# Patient Record
Sex: Male | Born: 1965
Health system: Southern US, Community
[De-identification: ages and names within clinical notes are randomized; demographics above are authoritative.]

---

## 2003-11-16 ENCOUNTER — Ambulatory Visit: Payer: Self-pay | Admitting: Sports Medicine

## 2003-11-18 ENCOUNTER — Ambulatory Visit: Payer: Self-pay | Admitting: Family Medicine

## 2004-05-01 ENCOUNTER — Ambulatory Visit: Payer: Self-pay | Admitting: Family Medicine

## 2006-04-17 DIAGNOSIS — E7800 Pure hypercholesterolemia, unspecified: Secondary | ICD-10-CM

## 2006-04-17 DIAGNOSIS — J45909 Unspecified asthma, uncomplicated: Secondary | ICD-10-CM | POA: Insufficient documentation

## 2008-06-03 ENCOUNTER — Encounter: Admission: RE | Admit: 2008-06-03 | Discharge: 2008-06-03 | Payer: Self-pay | Admitting: Family Medicine

## 2008-06-13 ENCOUNTER — Encounter: Admission: RE | Admit: 2008-06-13 | Discharge: 2008-06-13 | Payer: Self-pay | Admitting: Chiropractic Medicine

## 2008-10-26 ENCOUNTER — Emergency Department (HOSPITAL_COMMUNITY): Admission: EM | Admit: 2008-10-26 | Discharge: 2008-10-26 | Payer: Self-pay | Admitting: Emergency Medicine

## 2010-04-29 IMAGING — CR DG CERVICAL SPINE 2 OR 3 VIEWS
2 series · 2 of 2 positions shown · non-contrast
Comparison: None.

CLINICAL DATA: cervical radiculitis and paresthesia.

CERVICAL SPINE - 2-3 VIEW

[view not recorded (1 of 2)]
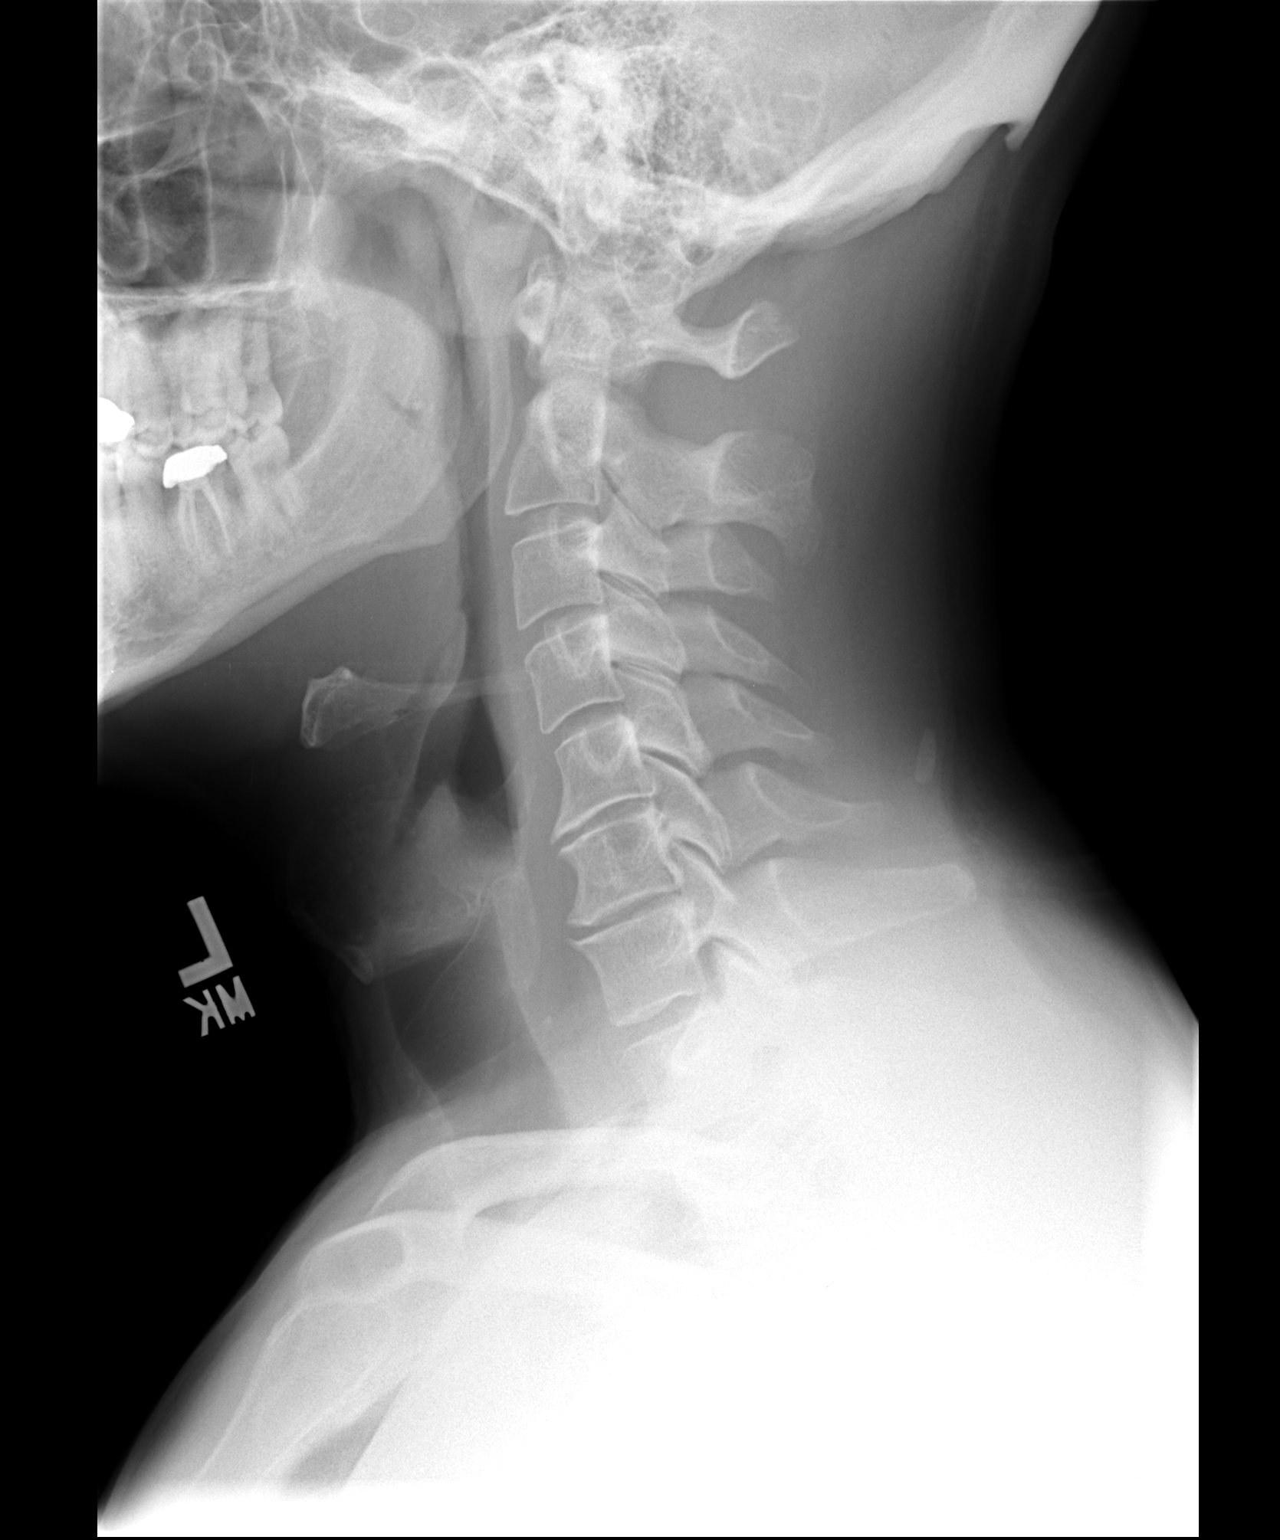

[view not recorded (2 of 2)]
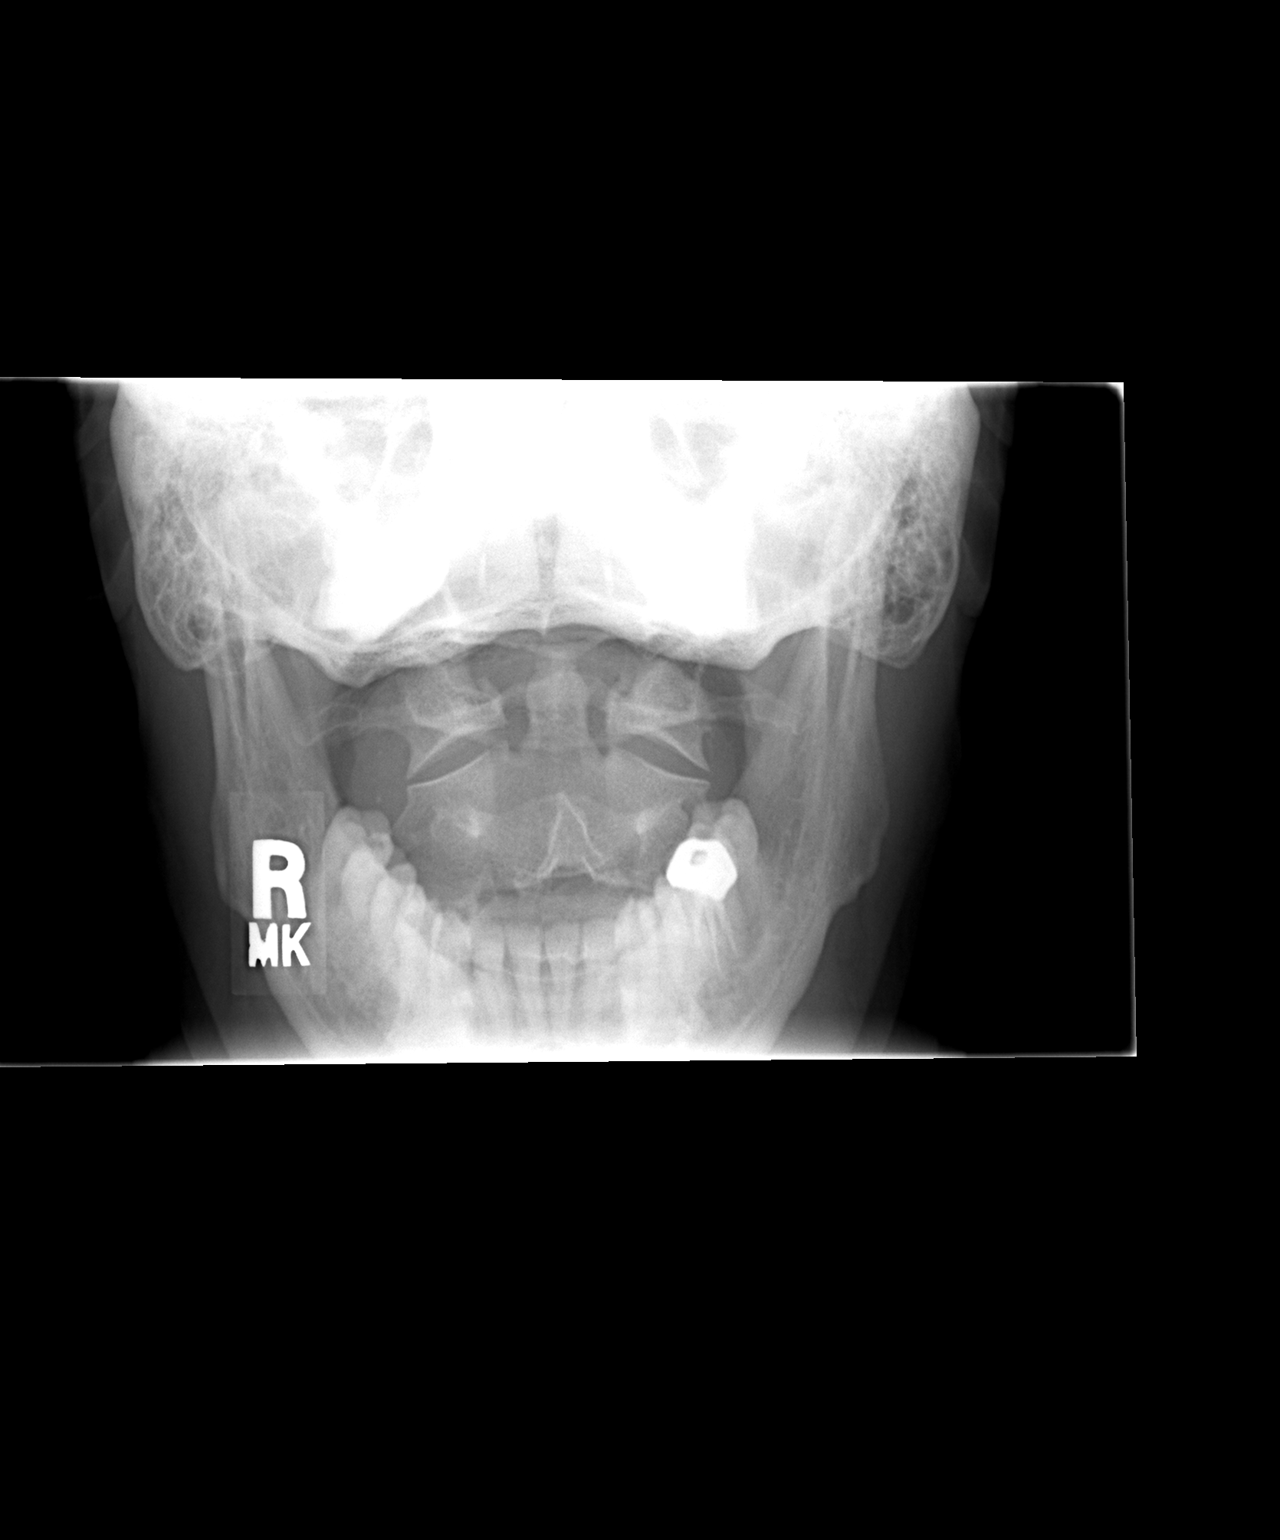

[2 of 2 positions shown; findings below may reference images not displayed]

FINDINGS: Lateral and odontoid views were  obtained, as requested.

Normal alignment and no fracture.  There is disc degeneration and
mild spurring at C5-6 and C6-7.  Oblique views were not obtained to
evaluate the neural foramen.  Remaining disc spaces are intact.
The facet joints appear normal.
IMPRESSION: Mild disc degeneration and spurring at C5-6 and C6-7.  No acute
bony abnormality.

## 2010-04-29 IMAGING — CR DG THORACOLUMBAR SPINE STANDING SCOLIOSIS
1 series · 3 of 3 positions shown · non-contrast
Comparison: None

CLINICAL DATA: Parasthesias.

THORACOLUMBAR SCOLIOSIS STUDY - STANDING VIEWS

[Series 1001: view not recorded · 0.40mm/px · 3 of 3 slices shown]
[im 1/3]
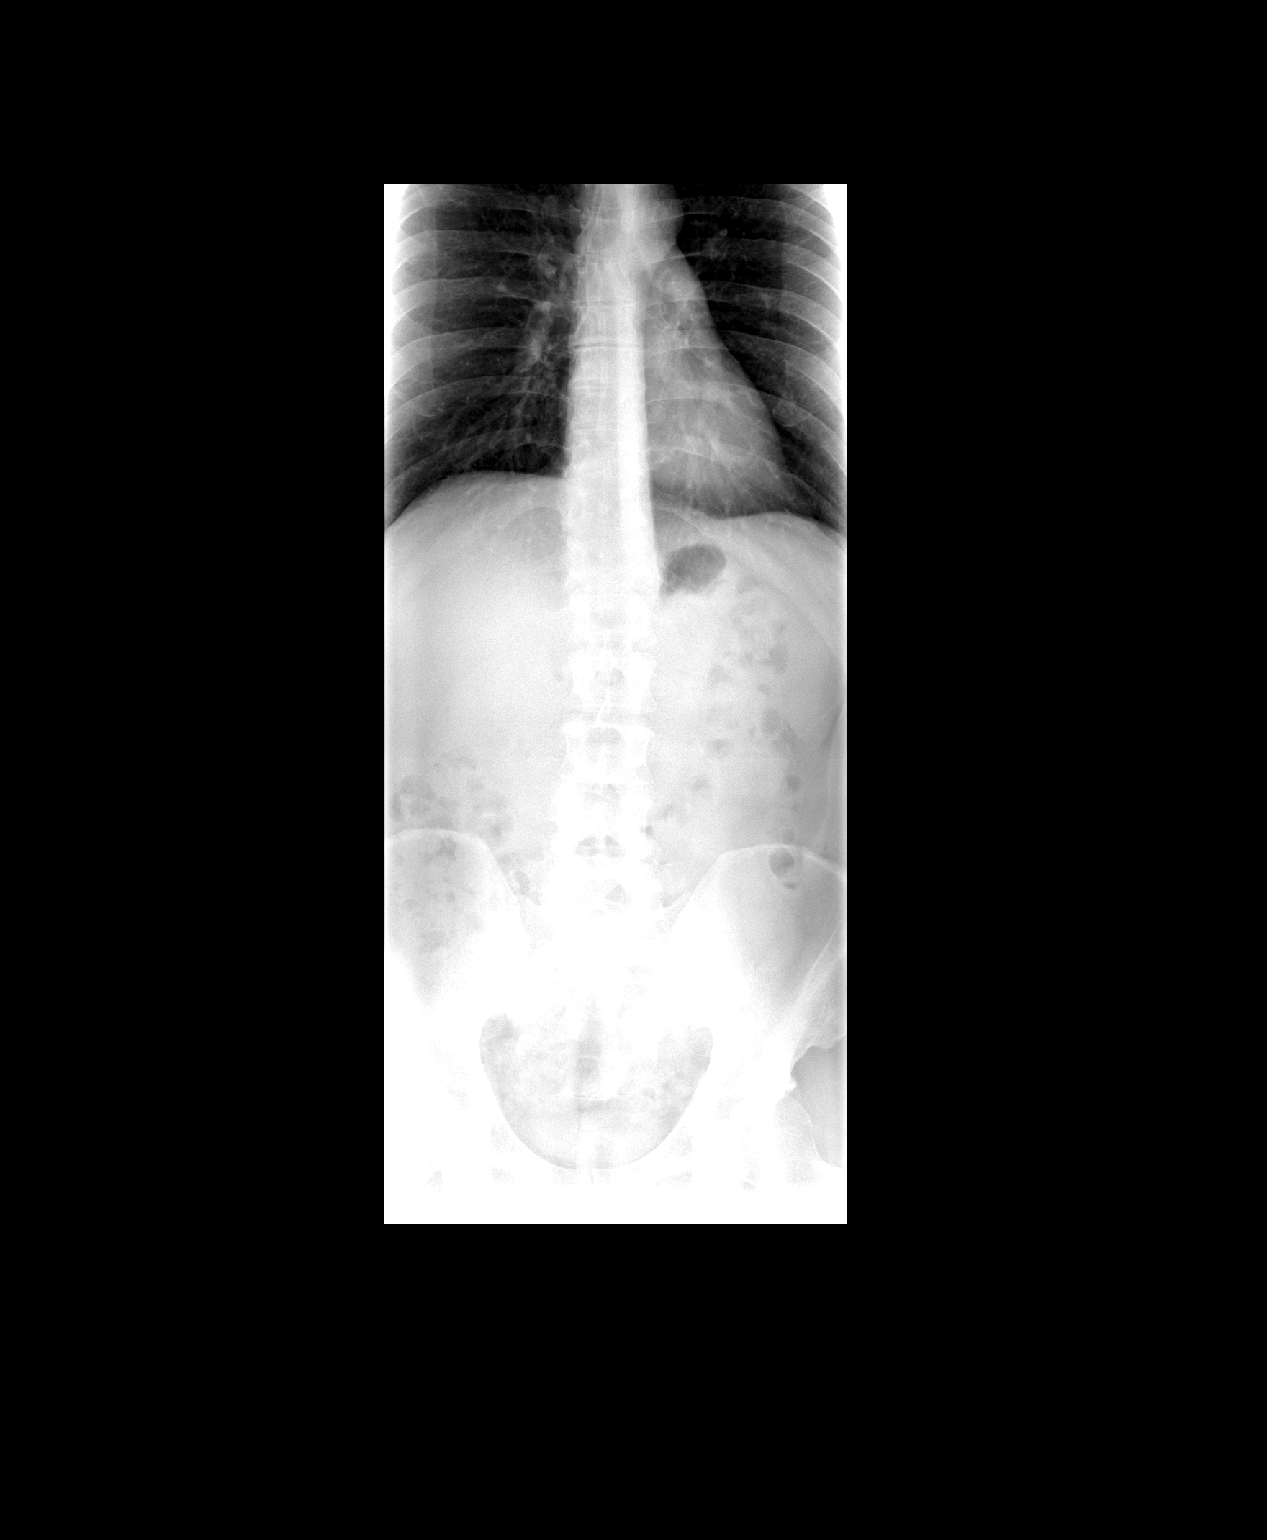
[im 2/3]
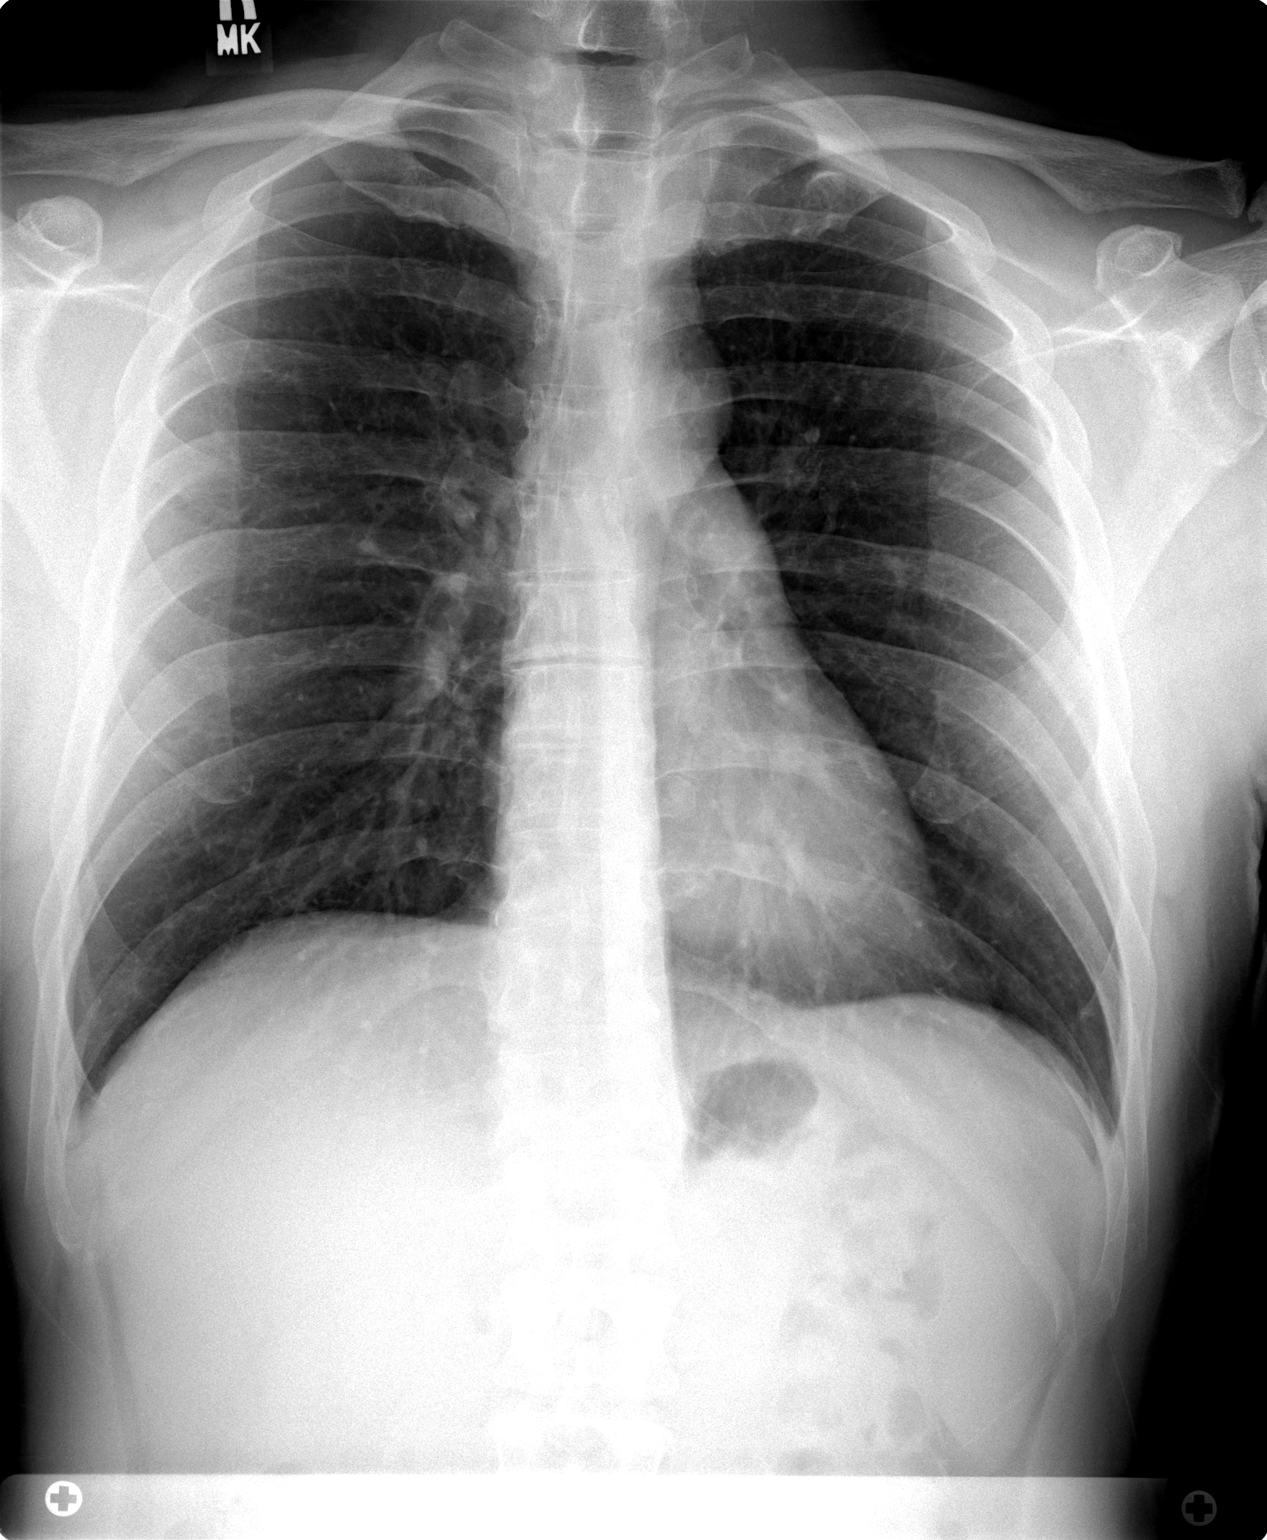
[im 3/3]
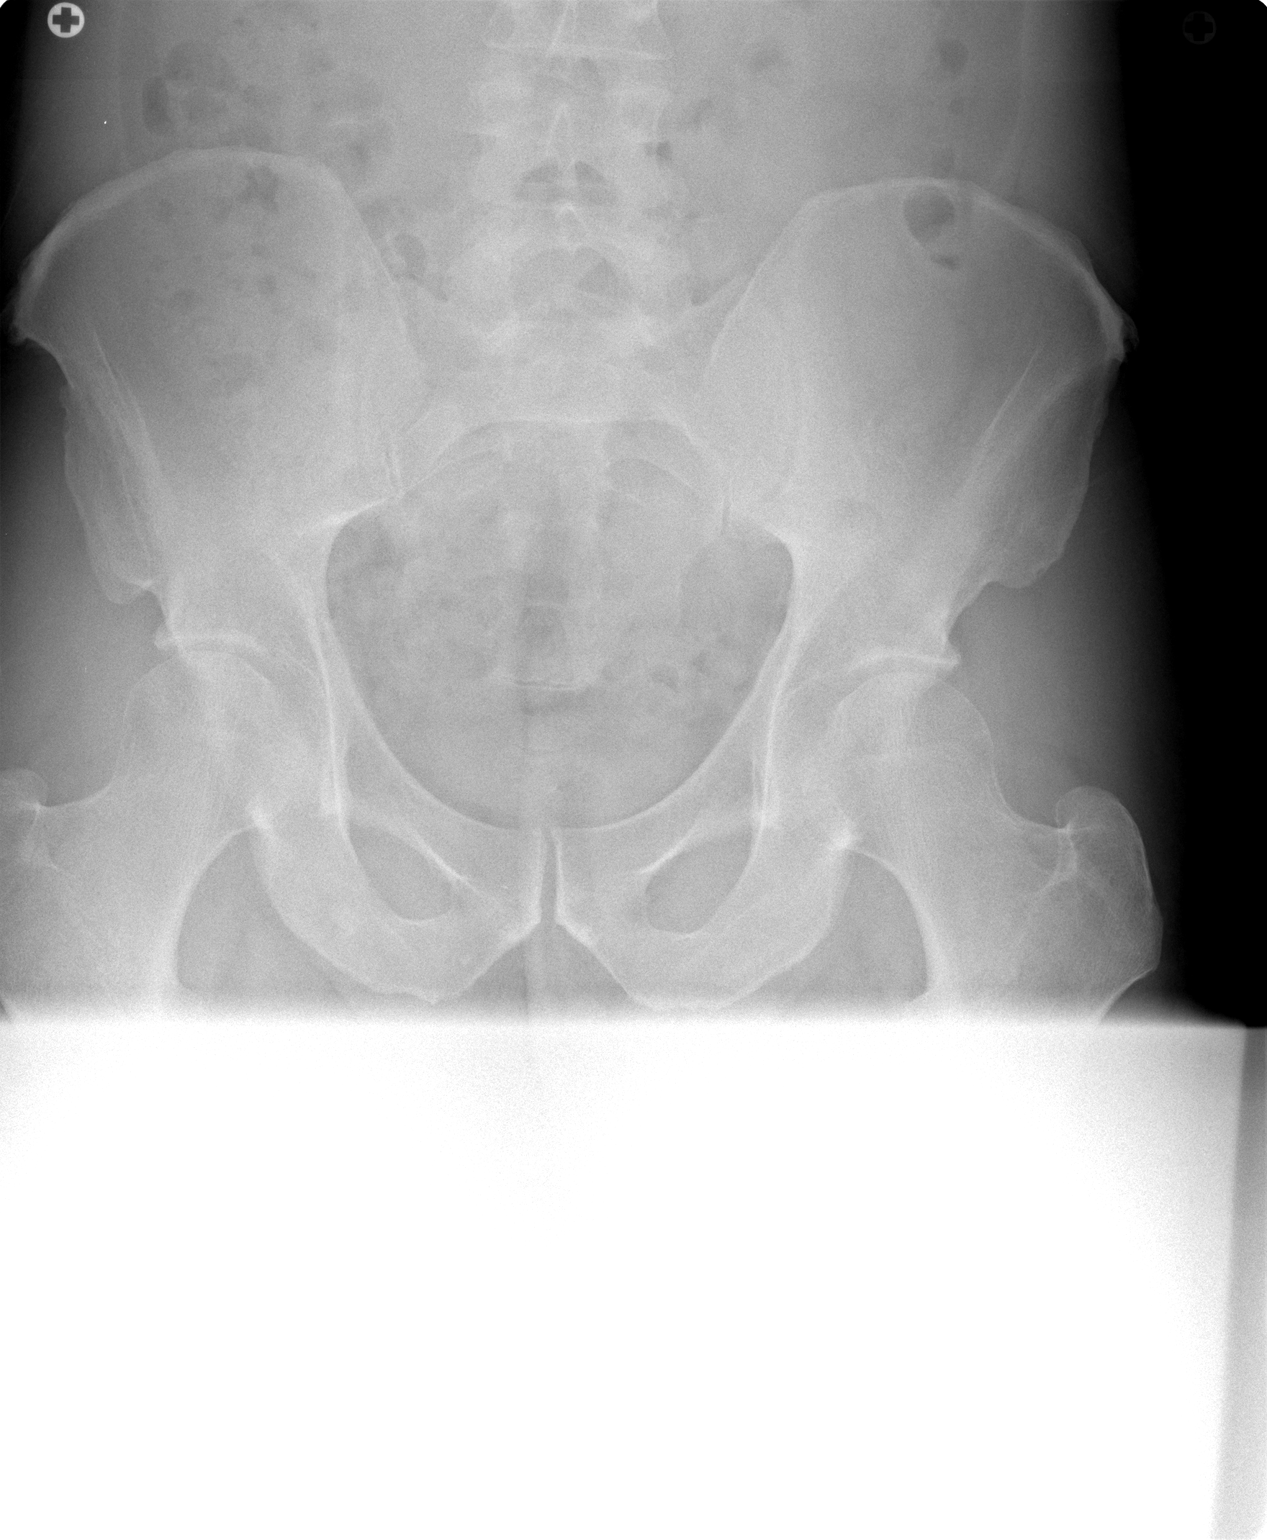

[3 of 3 positions shown; findings below may reference images not displayed]

FINDINGS: Very slight dextroscoliosis in the thoracic spine and
levoscoliosis in the lumbar spine with the apex at L3-4.  No
vertebral congenital anomaly.  Lungs are grossly clear.  Abdomen is
also grossly within normal limits.  Moderate narrowing of the
superior right hip joint with minimal juxta-articular sclerosis.
SI joints are unremarkable.
IMPRESSION: Very minimal scoliosis as described.

Right hip degenerative change.

## 2016-05-06 DIAGNOSIS — Z2089 Contact with and (suspected) exposure to other communicable diseases: Secondary | ICD-10-CM | POA: Diagnosis not present

## 2016-12-23 DIAGNOSIS — R42 Dizziness and giddiness: Secondary | ICD-10-CM | POA: Diagnosis not present

## 2017-03-19 DIAGNOSIS — R0789 Other chest pain: Secondary | ICD-10-CM | POA: Diagnosis not present

## 2017-03-19 DIAGNOSIS — Z0000 Encounter for general adult medical examination without abnormal findings: Secondary | ICD-10-CM | POA: Diagnosis not present

## 2017-03-19 DIAGNOSIS — E663 Overweight: Secondary | ICD-10-CM | POA: Diagnosis not present

## 2017-03-19 DIAGNOSIS — Z800 Family history of malignant neoplasm of digestive organs: Secondary | ICD-10-CM | POA: Diagnosis not present

## 2017-03-19 DIAGNOSIS — Z1389 Encounter for screening for other disorder: Secondary | ICD-10-CM | POA: Diagnosis not present

## 2017-03-19 DIAGNOSIS — Z23 Encounter for immunization: Secondary | ICD-10-CM | POA: Diagnosis not present

## 2017-03-19 DIAGNOSIS — Z125 Encounter for screening for malignant neoplasm of prostate: Secondary | ICD-10-CM | POA: Diagnosis not present

## 2017-04-11 DIAGNOSIS — R0789 Other chest pain: Secondary | ICD-10-CM | POA: Diagnosis not present

## 2017-04-15 DIAGNOSIS — E663 Overweight: Secondary | ICD-10-CM | POA: Diagnosis not present

## 2017-04-15 DIAGNOSIS — R0789 Other chest pain: Secondary | ICD-10-CM | POA: Diagnosis not present

## 2017-04-15 DIAGNOSIS — Z8249 Family history of ischemic heart disease and other diseases of the circulatory system: Secondary | ICD-10-CM | POA: Diagnosis not present

## 2017-04-15 DIAGNOSIS — E7800 Pure hypercholesterolemia, unspecified: Secondary | ICD-10-CM | POA: Diagnosis not present

## 2017-10-07 DIAGNOSIS — Z125 Encounter for screening for malignant neoplasm of prostate: Secondary | ICD-10-CM | POA: Diagnosis not present

## 2017-10-07 DIAGNOSIS — Z0000 Encounter for general adult medical examination without abnormal findings: Secondary | ICD-10-CM | POA: Diagnosis not present

## 2017-10-14 DIAGNOSIS — E7800 Pure hypercholesterolemia, unspecified: Secondary | ICD-10-CM | POA: Diagnosis not present

## 2017-10-14 DIAGNOSIS — Z23 Encounter for immunization: Secondary | ICD-10-CM | POA: Diagnosis not present

## 2017-10-14 DIAGNOSIS — Z1389 Encounter for screening for other disorder: Secondary | ICD-10-CM | POA: Diagnosis not present

## 2017-10-14 DIAGNOSIS — Z800 Family history of malignant neoplasm of digestive organs: Secondary | ICD-10-CM | POA: Diagnosis not present

## 2017-10-14 DIAGNOSIS — Z8249 Family history of ischemic heart disease and other diseases of the circulatory system: Secondary | ICD-10-CM | POA: Diagnosis not present

## 2017-10-14 DIAGNOSIS — Z0000 Encounter for general adult medical examination without abnormal findings: Secondary | ICD-10-CM | POA: Diagnosis not present

## 2017-10-14 DIAGNOSIS — D126 Benign neoplasm of colon, unspecified: Secondary | ICD-10-CM | POA: Diagnosis not present

## 2017-10-21 DIAGNOSIS — Z1212 Encounter for screening for malignant neoplasm of rectum: Secondary | ICD-10-CM | POA: Diagnosis not present

## 2018-10-13 DIAGNOSIS — E7800 Pure hypercholesterolemia, unspecified: Secondary | ICD-10-CM | POA: Diagnosis not present

## 2018-10-13 DIAGNOSIS — Z125 Encounter for screening for malignant neoplasm of prostate: Secondary | ICD-10-CM | POA: Diagnosis not present

## 2018-10-13 DIAGNOSIS — Z0000 Encounter for general adult medical examination without abnormal findings: Secondary | ICD-10-CM | POA: Diagnosis not present

## 2018-10-20 DIAGNOSIS — E7801 Familial hypercholesterolemia: Secondary | ICD-10-CM | POA: Diagnosis not present

## 2018-10-20 DIAGNOSIS — Z1331 Encounter for screening for depression: Secondary | ICD-10-CM | POA: Diagnosis not present

## 2018-10-20 DIAGNOSIS — D126 Benign neoplasm of colon, unspecified: Secondary | ICD-10-CM | POA: Diagnosis not present

## 2018-10-20 DIAGNOSIS — Z0000 Encounter for general adult medical examination without abnormal findings: Secondary | ICD-10-CM | POA: Diagnosis not present

## 2018-10-20 DIAGNOSIS — E663 Overweight: Secondary | ICD-10-CM | POA: Diagnosis not present

## 2018-10-20 DIAGNOSIS — Z8249 Family history of ischemic heart disease and other diseases of the circulatory system: Secondary | ICD-10-CM | POA: Diagnosis not present

## 2018-12-23 DIAGNOSIS — Z20828 Contact with and (suspected) exposure to other viral communicable diseases: Secondary | ICD-10-CM | POA: Diagnosis not present

## 2018-12-23 DIAGNOSIS — Z7189 Other specified counseling: Secondary | ICD-10-CM | POA: Diagnosis not present

## 2018-12-23 DIAGNOSIS — Z03818 Encounter for observation for suspected exposure to other biological agents ruled out: Secondary | ICD-10-CM | POA: Diagnosis not present

## 2019-06-04 ENCOUNTER — Ambulatory Visit: Payer: BC Managed Care – PPO | Attending: Internal Medicine

## 2019-06-04 DIAGNOSIS — Z23 Encounter for immunization: Secondary | ICD-10-CM

## 2019-06-04 NOTE — Progress Notes (Signed)
   Covid-19 Vaccination Clinic  Name:  Anthony Stanton    MRN: [id number] DOB: [birth year]  06/04/2019  [name] was observed post Covid-19 immunization for 15 minutes without incident. He was provided with Vaccine Information Sheet and instruction to access the V-Safe system.   [name] was instructed to call 911 with any severe reactions post vaccine: Marland Kitchen Difficulty breathing  . Swelling of face and throat  . A fast heartbeat  . A bad rash all over body  . Dizziness and weakness   Immunizations Administered    Name Date Dose VIS Date Route   Pfizer COVID-19 Vaccine 06/04/2019  1:31 PM 0.3 mL 01/29/2019 Intramuscular   Manufacturer: [employer], [employer]   Lot: [id number]   NDC: [address]-1

## 2019-06-10 DIAGNOSIS — Z20828 Contact with and (suspected) exposure to other viral communicable diseases: Secondary | ICD-10-CM | POA: Diagnosis not present

## 2019-06-10 DIAGNOSIS — Z03818 Encounter for observation for suspected exposure to other biological agents ruled out: Secondary | ICD-10-CM | POA: Diagnosis not present

## 2019-06-29 ENCOUNTER — Ambulatory Visit: Payer: BC Managed Care – PPO

## 2019-07-05 ENCOUNTER — Ambulatory Visit: Payer: BC Managed Care – PPO | Attending: Internal Medicine

## 2019-07-05 DIAGNOSIS — Z23 Encounter for immunization: Secondary | ICD-10-CM

## 2019-07-05 NOTE — Progress Notes (Signed)
   Covid-19 Vaccination Clinic  Name:  Anthony Stanton    MRN: [id number] DOB: 1965/08/12  07/05/2019  [name] was observed post Covid-19 immunization for 15 minutes without incident. He was provided with Vaccine Information Sheet and instruction to access the V-Safe system.   [name] was instructed to call 911 with any severe reactions post vaccine: Marland Kitchen Difficulty breathing  . Swelling of face and throat  . A fast heartbeat  . A bad rash all over body  . Dizziness and weakness   Immunizations Administered    Name Date Dose VIS Date Route   Pfizer COVID-19 Vaccine 07/05/2019  1:18 PM 0.3 mL 04/14/2018 Intramuscular   Manufacturer: [employer], [employer]   Lot: [id number]   NDC: [address]-3

## 2019-08-01 ENCOUNTER — Encounter (HOSPITAL_COMMUNITY): Payer: Self-pay | Admitting: Emergency Medicine

## 2019-08-01 ENCOUNTER — Emergency Department (HOSPITAL_COMMUNITY)
Admission: EM | Admit: 2019-08-01 | Discharge: 2019-08-01 | Disposition: A | Discharge status: Home / Self Care | Payer: BC Managed Care – PPO | Attending: Emergency Medicine | Admitting: Emergency Medicine

## 2019-08-01 ENCOUNTER — Other Ambulatory Visit: Payer: Self-pay

## 2019-08-01 ENCOUNTER — Emergency Department (HOSPITAL_COMMUNITY): Payer: BC Managed Care – PPO

## 2019-08-01 DIAGNOSIS — I213 ST elevation (STEMI) myocardial infarction of unspecified site: Secondary | ICD-10-CM | POA: Diagnosis not present

## 2019-08-01 DIAGNOSIS — F410 Panic disorder [episodic paroxysmal anxiety] without agoraphobia: Secondary | ICD-10-CM | POA: Insufficient documentation

## 2019-08-01 DIAGNOSIS — R55 Syncope and collapse: Secondary | ICD-10-CM | POA: Insufficient documentation

## 2019-08-01 DIAGNOSIS — R1011 Right upper quadrant pain: Secondary | ICD-10-CM

## 2019-08-01 DIAGNOSIS — R0781 Pleurodynia: Secondary | ICD-10-CM | POA: Diagnosis not present

## 2019-08-01 DIAGNOSIS — J984 Other disorders of lung: Secondary | ICD-10-CM | POA: Diagnosis not present

## 2019-08-01 DIAGNOSIS — R0789 Other chest pain: Secondary | ICD-10-CM | POA: Diagnosis not present

## 2019-08-01 DIAGNOSIS — R079 Chest pain, unspecified: Secondary | ICD-10-CM | POA: Diagnosis not present

## 2019-08-01 LAB — BASIC METABOLIC PANEL
Anion gap: 8 (ref 5–15)
BUN: 19 mg/dL (ref 6–20)
CO2: 26 mmol/L (ref 22–32)
Calcium: 8.9 mg/dL (ref 8.9–10.3)
Chloride: 105 mmol/L (ref 98–111)
Creatinine, Ser: 1.27 mg/dL — ABNORMAL HIGH (ref 0.61–1.24)
GFR calc Af Amer: >60 mL/min (ref >60)
GFR calc non Af Amer: >60 mL/min (ref >60)
Glucose, Bld: 102 mg/dL — ABNORMAL HIGH (ref 70–99)
Potassium: 4.2 mmol/L (ref 3.5–5.1)
Sodium: 139 mmol/L (ref 135–145)

## 2019-08-01 LAB — HEPATIC FUNCTION PANEL
ALT: 28 U/L (ref 0–44)
AST: 23 U/L (ref 15–41)
Albumin: 4.2 g/dL (ref 3.5–5.0)
Alkaline Phosphatase: 57 U/L (ref 38–126)
Bilirubin, Direct: 0.1 mg/dL (ref 0.0–0.2)
Indirect Bilirubin: 0.8 mg/dL (ref 0.3–0.9)
Total Bilirubin: 0.9 mg/dL (ref 0.3–1.2)
Total Protein: 7 g/dL (ref 6.5–8.1)

## 2019-08-01 LAB — CBC
HCT: 45.3 % (ref 39.0–52.0)
Hemoglobin: 14.7 g/dL (ref 13.0–17.0)
MCH: 28.1 pg (ref 26.0–34.0)
MCHC: 32.5 g/dL (ref 30.0–36.0)
MCV: 86.5 fL (ref 80.0–100.0)
Platelets: 213 10*3/uL (ref 150–400)
RBC: 5.24 MIL/uL (ref 4.22–5.81)
RDW: 13.6 % (ref 11.5–15.5)
WBC: 7.8 10*3/uL (ref 4.0–10.5)
nRBC: 0 % (ref 0.0–0.2)

## 2019-08-01 LAB — TROPONIN I (HIGH SENSITIVITY): Troponin I (High Sensitivity): 3 ng/L (ref <18)

## 2019-08-01 MED ORDER — SODIUM CHLORIDE 0.9% FLUSH: 3.0000 mL | Freq: Once | INTRAVENOUS | Status: DC

## 2019-08-01 NOTE — Discharge Instructions (Addendum)
You presented to the ED after having an episode of near syncope when thinking about your right upper quadrant pain.  An acute coronary syndrome work-up was done with a normal troponin, EKG without concerning findings, chest x-ray within normal limits.  Your complete blood count, basic metabolic panel as well as hepatic/liver and gallbladder labs were unremarkable.  As you have intermittent right upper quadrant pain an ultrasound may be indicated in the future to evaluate your gallbladder.  Please discuss this with your primary care physician.  Additionally he still could have anxiety at times.  Recommend following up with your employers mental health resources.

## 2019-08-01 NOTE — ED Provider Notes (Signed)
[facility] EMERGENCY DEPARTMENT Provider Note   CSN: [id number] Arrival date & time: 08/01/19  1508     History Chief Complaint  Patient presents with   Near Syncope    Anthony Stanton is a 54 y.o. male.  Patient is a relatively healthy 54 year old male who was driving his car today when he started thinking about his 2 to 3 weeks of right upper quadrant pain and rib pain in the end he already about cancer and started feeling like he was going to pass out, patient pulled to the side of the road did not pass out but was diaphoretic with hypotension for EMS with resolution improvement with cold water application by a bystander.  Patient denies any significant chest pain at this time.  Patient has had right lower rib pain as well as right upper quadrant pain that has been intermittent for the last 2 to 3 weeks.  Patient states that his mother died of lung cancer and colon cancer.  Patient has had colonoscopies with polyps are unremarkable.  He has close follow-up planned in the near future for repeat colonoscopy.  Patient truly feels this was an anxiety attack due to his concern about cancer.  Patient states this is happened 1 time in the past.  Chest pain HPI: A 54 year old patient with a history of hypercholesterolemia presents for evaluation of chest pain. Initial onset of pain was more than 6 hours ago. The patient's chest pain is well-localized and is not worse with exertion. The patient's chest pain is not middle- or left-sided, is not described as heaviness/pressure/tightness, is not sharp and does not radiate to the arms/jaw/neck. The patient does not complain of nausea and denies diaphoresis. The patient has no history of stroke, has no history of peripheral artery disease, has not smoked in the past 90 days, denies any history of treated diabetes, has no relevant family history of coronary artery disease (first degree relative at less than age 54), is not hypertensive and  does not have an elevated BMI (>=30).   The history is provided by the patient and medical records.  Illness Location:  Constitutional Quality:  Diaphoresis, near syncope Severity:  Mild Onset quality:  Sudden Timing:  Constant Progression:  Resolved Chronicity:  Recurrent Context:  Patient was having thoughts about cancer and concerns about getting sick. Relieved by:  Cold water, redirection Worsened by:  Stress, stressful thoughts Ineffective treatments:  None tried Associated symptoms: abdominal pain and chest pain   Associated symptoms: no congestion, no cough, no fever, no headaches, no loss of consciousness, no nausea, no shortness of breath and no vomiting       History reviewed. No pertinent past medical history.  Patient Active Problem List   Diagnosis Date Noted   HYPERCHOLESTEROLEMIA 04/17/2006   ASTHMA, UNSPECIFIED 04/17/2006    History reviewed. No pertinent surgical history.     No family history on file.  Social History   Tobacco Use   Smoking status: Never Smoker   Smokeless tobacco: Never Used  Substance Use Topics   Alcohol use: Yes   Drug use: Never    Home Medications Prior to Admission medications   Not on File    Allergies    Patient has no allergy information on record.  Review of Systems   Review of Systems  Constitutional: Positive for diaphoresis. Negative for fever.  HENT: Negative for congestion.   Respiratory: Negative for cough and shortness of breath.   Cardiovascular: Positive for chest pain.  Gastrointestinal: Positive for abdominal pain. Negative for nausea and vomiting.  Neurological: Positive for light-headedness. Negative for loss of consciousness and headaches.  All other systems reviewed and are negative.   Physical Exam Updated Vital Signs BP (!) 149/97    Pulse 75    Temp 98.7 F (37.1 C) (Oral)    Resp 12    Ht 5\' 9"  (1.753 m)    Wt 86.2 kg    SpO2 97%    BMI 28.06 kg/m   Physical Exam Vitals and  nursing note reviewed.  Constitutional:      General: He is not in acute distress.    Appearance: He is well-developed. He is not ill-appearing.  HENT:     Head: Normocephalic and atraumatic.     Right Ear: External ear normal.     Left Ear: External ear normal.     Nose: Nose normal.     Mouth/Throat:     Mouth: Mucous membranes are moist.     Pharynx: Oropharynx is clear.  Eyes:     Extraocular Movements: Extraocular movements intact.     Conjunctiva/sclera: Conjunctivae normal.     Pupils: Pupils are equal, round, and reactive to light.  Cardiovascular:     Rate and Rhythm: Normal rate and regular rhythm.     Pulses: Normal pulses.     Heart sounds: No murmur heard.   Pulmonary:     Effort: Pulmonary effort is normal. No respiratory distress.     Breath sounds: Normal breath sounds.  Abdominal:     Palpations: Abdomen is soft.     Tenderness: There is no abdominal tenderness.  Musculoskeletal:        General: No swelling, deformity or signs of injury. Normal range of motion.     Cervical back: Neck supple.  Skin:    General: Skin is warm and dry.  Neurological:     General: No focal deficit present.     Mental Status: He is alert and oriented to person, place, and time.     Sensory: No sensory deficit.     Motor: No weakness.  Psychiatric:        Mood and Affect: Mood normal.        Behavior: Behavior normal.        Thought Content: Thought content normal.        Judgment: Judgment normal.     ED Results / Procedures / Treatments   Labs (all labs ordered are listed, but only abnormal results are displayed) Labs Reviewed  BASIC METABOLIC PANEL - Abnormal; Notable for the following components:      Result Value   Glucose, Bld 102 (*)    Creatinine, Ser 1.27 (*)    All other components within normal limits  CBC  HEPATIC FUNCTION PANEL  TROPONIN I (HIGH SENSITIVITY)    EKG EKG Interpretation  Date/Time:  Sunday August 01 2019 17:23:49 EDT Ventricular Rate:    60 PR Interval:    QRS Duration: 114 QT Interval:  410 QTC Calculation: 410 R Axis:   43 Text Interpretation: Sinus rhythm Incomplete right bundle branch block Confirmed by [id number] (Gwyneth Sprout) on 08/01/2019 7:05:50 PM   Radiology DG Chest 2 View  Result Date: 08/01/2019 CLINICAL DATA:  RIGHT upper quadrant and rib pain with near syncope EXAM: CHEST - 2 VIEW COMPARISON:  06/03/2008 FINDINGS: Cardiomediastinal contours and hilar structures are normal. Lungs are clear.  No sign of pleural effusion. Visualized skeletal structures are unremarkable to the  extent evaluated. IMPRESSION: No acute cardiopulmonary disease. Electronically Signed   By: Zetta Bills M.D.   On: 08/01/2019 18:25    Procedures Procedures (including critical care time)  Medications Ordered in ED Medications - No data to display  ED Course  I have reviewed the triage vital signs and the nursing notes.  Pertinent labs & imaging results that were available during my care of the patient were reviewed by me and considered in my medical decision making (see chart for details).    MDM Rules/Calculators/A&P HEAR Score: 3                        Differential diagnosis: Anxiety attack, near syncope, right upper quadrant pain, ACS  ED physician interpretation of imaging: Chest x-ray without hemopneumothorax, wide mediastinum, focal ammonia or obvious rib fractures. ED physician interpretation of EKG: No STEMI.  Some T wave changes on initial EKG, this was repeated and only partial right bundle branch block remains.  Likely baseline.  Otherwise normal sinus rhythm. ED physician interpretation of labs: CBC, UA, hepatic panel, BMP without critical values.  No signs of kidney, liver or biliary dysfunction.  1 troponin unremarkable.  MDM: Patient is a 54 year old male presented to the ED with EMS after an episode of near syncope, diaphoresis resolved prior to arrival in the ED with unremarkable ACS work-up, abdominal lab  work-up with resolved symptoms most likely having a resolved panic attack.  Patient's vital signs are stable, patient afebrile.  Patient physical exam unremarkable.  ACS work-up unremarkable, doubt cardiac cause of patient's near syncope.  Patient has no right upper quadrant pain, unremarkable lab work.  Doubt acute cholecystitis or gallbladder disorder at this time.  Patient reassured by ED work-up.  Will follow up with his primary care physician.  Diagnosis, treatment and plan of care was discussed and agreed upon with patient.  Patient comfortable with discharge at this time.    Key discharge instructions: You presented to the ED after having an episode of near syncope when thinking about your right upper quadrant pain.  An acute coronary syndrome work-up was done with a normal troponin, EKG without concerning findings, chest x-ray within normal limits.  Your complete blood count, basic metabolic panel as well as hepatic/liver and gallbladder labs were unremarkable.  As you have intermittent right upper quadrant pain an ultrasound may be indicated in the future to evaluate your gallbladder.  Please discuss this with your primary care physician.  Additionally he still could have anxiety at times.  Recommend following up with your employers mental health resources.   Final Clinical Impression(s) / ED Diagnoses Final diagnoses:  Near syncope  Panic attack  Right upper quadrant pain    Rx / DC Orders ED Discharge Orders    None       Delma Post, MD 08/02/19 [id number]    Blanchie Dessert, MD 08/02/19 [id number]

## 2019-08-01 NOTE — ED Triage Notes (Addendum)
Pt to triage via [facility] from side of road.  Sudden onset dizziness and diaphoresis while driving.  Felt like he was going to pass out. On EMS arrival, pt cool, pale, and diaphoretic.  Pt believes he may have had an anxiety attack.  Reports R intercostal pain x 2 weeks.  Initial BP 94/70- EMS administered NS 400cc.  Denies chest pain or SOB at present.

## 2019-08-01 NOTE — ED Notes (Signed)
Patient verbalizes understanding of discharge instructions. Opportunity for questioning and answers were provided. Armband removed by staff, pt discharged from ED. Pt. ambulatory and discharged home.  

## 2019-10-15 DIAGNOSIS — Z125 Encounter for screening for malignant neoplasm of prostate: Secondary | ICD-10-CM | POA: Diagnosis not present

## 2019-10-15 DIAGNOSIS — Z0000 Encounter for general adult medical examination without abnormal findings: Secondary | ICD-10-CM | POA: Diagnosis not present

## 2019-10-15 DIAGNOSIS — E7800 Pure hypercholesterolemia, unspecified: Secondary | ICD-10-CM | POA: Diagnosis not present

## 2019-10-22 DIAGNOSIS — Z0000 Encounter for general adult medical examination without abnormal findings: Secondary | ICD-10-CM | POA: Diagnosis not present

## 2019-10-22 DIAGNOSIS — E7801 Familial hypercholesterolemia: Secondary | ICD-10-CM | POA: Diagnosis not present

## 2019-11-12 DIAGNOSIS — Z1212 Encounter for screening for malignant neoplasm of rectum: Secondary | ICD-10-CM | POA: Diagnosis not present

## 2019-12-20 DIAGNOSIS — M5417 Radiculopathy, lumbosacral region: Secondary | ICD-10-CM | POA: Diagnosis not present

## 2019-12-20 DIAGNOSIS — M545 Low back pain, unspecified: Secondary | ICD-10-CM | POA: Diagnosis not present

## 2020-01-12 DIAGNOSIS — M5431 Sciatica, right side: Secondary | ICD-10-CM | POA: Diagnosis not present

## 2020-01-18 DIAGNOSIS — M5431 Sciatica, right side: Secondary | ICD-10-CM | POA: Diagnosis not present

## 2020-01-25 DIAGNOSIS — M5431 Sciatica, right side: Secondary | ICD-10-CM | POA: Diagnosis not present

## 2020-02-26 DIAGNOSIS — Z1152 Encounter for screening for COVID-19: Secondary | ICD-10-CM | POA: Diagnosis not present

## 2020-10-31 DIAGNOSIS — Z125 Encounter for screening for malignant neoplasm of prostate: Secondary | ICD-10-CM | POA: Diagnosis not present

## 2020-10-31 DIAGNOSIS — E7800 Pure hypercholesterolemia, unspecified: Secondary | ICD-10-CM | POA: Diagnosis not present

## 2020-11-07 DIAGNOSIS — Z1212 Encounter for screening for malignant neoplasm of rectum: Secondary | ICD-10-CM | POA: Diagnosis not present

## 2020-11-07 DIAGNOSIS — Z0000 Encounter for general adult medical examination without abnormal findings: Secondary | ICD-10-CM | POA: Diagnosis not present

## 2020-11-07 DIAGNOSIS — Z23 Encounter for immunization: Secondary | ICD-10-CM | POA: Diagnosis not present

## 2021-06-16 IMAGING — CR DG CHEST 2V
2 series · 2 of 2 positions shown · non-contrast
Comparison: 06/03/2008

CLINICAL DATA: RIGHT upper quadrant and rib pain with near syncope

EXAM:
CHEST - 2 VIEW

[chest pa]
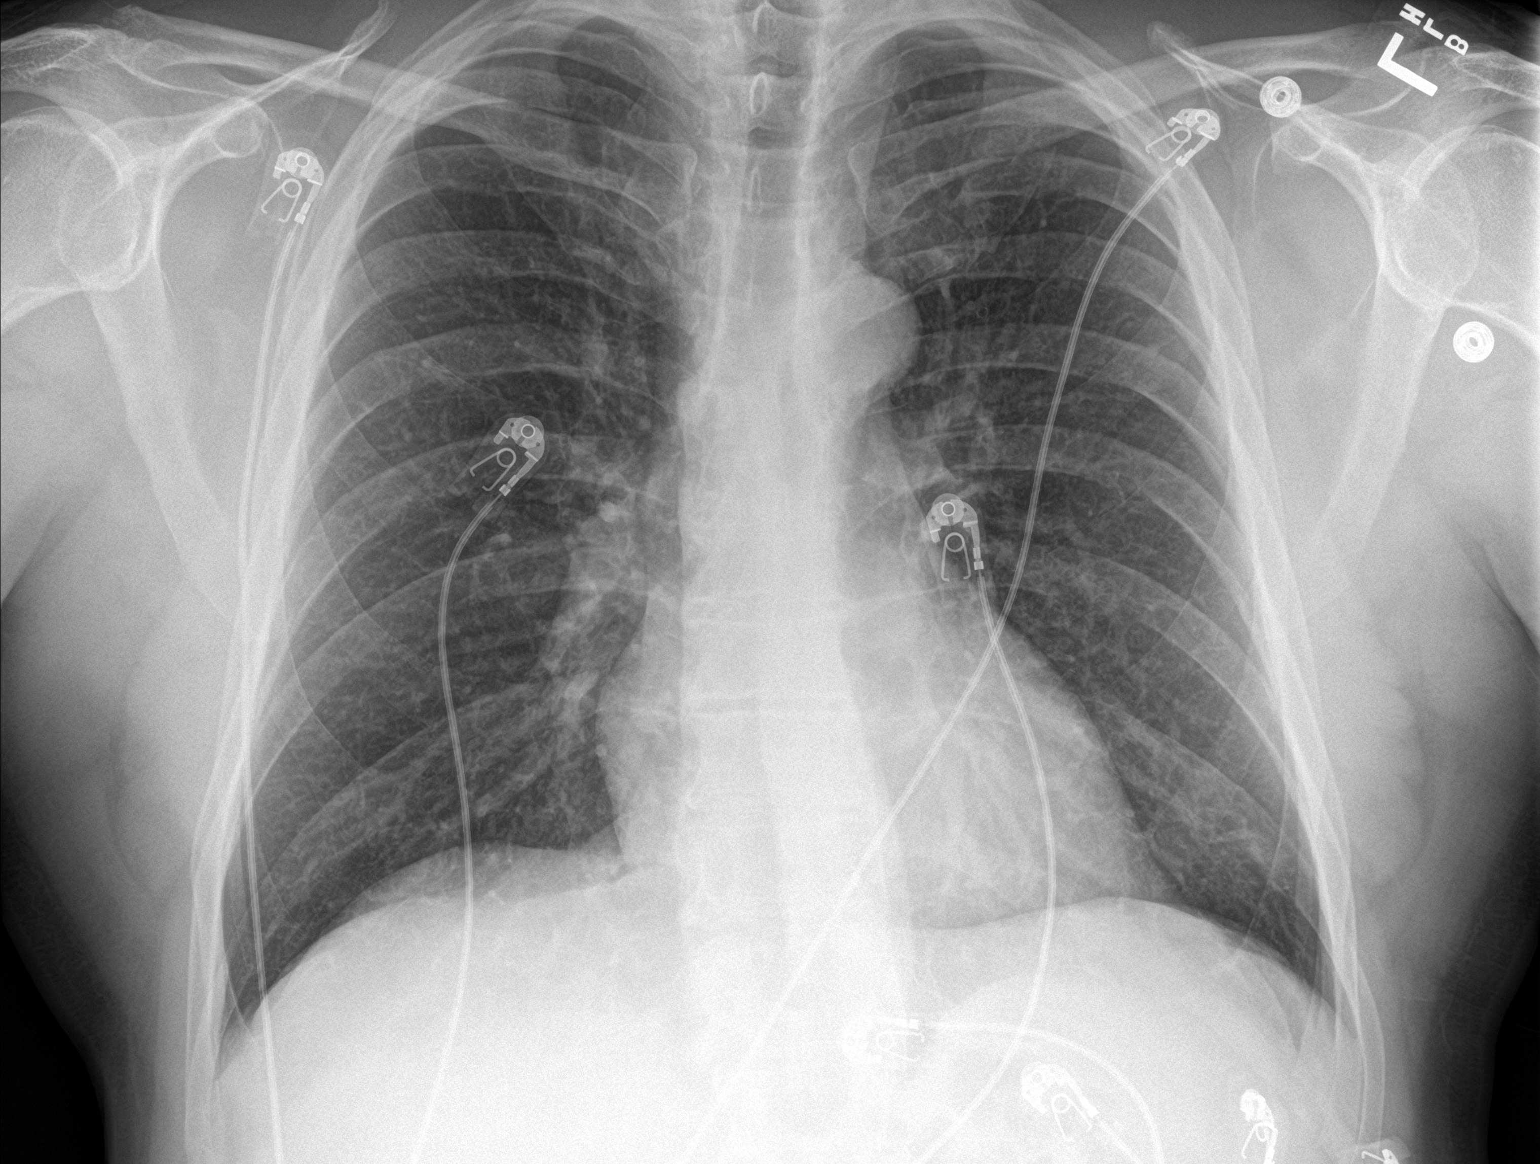

[chest lat]
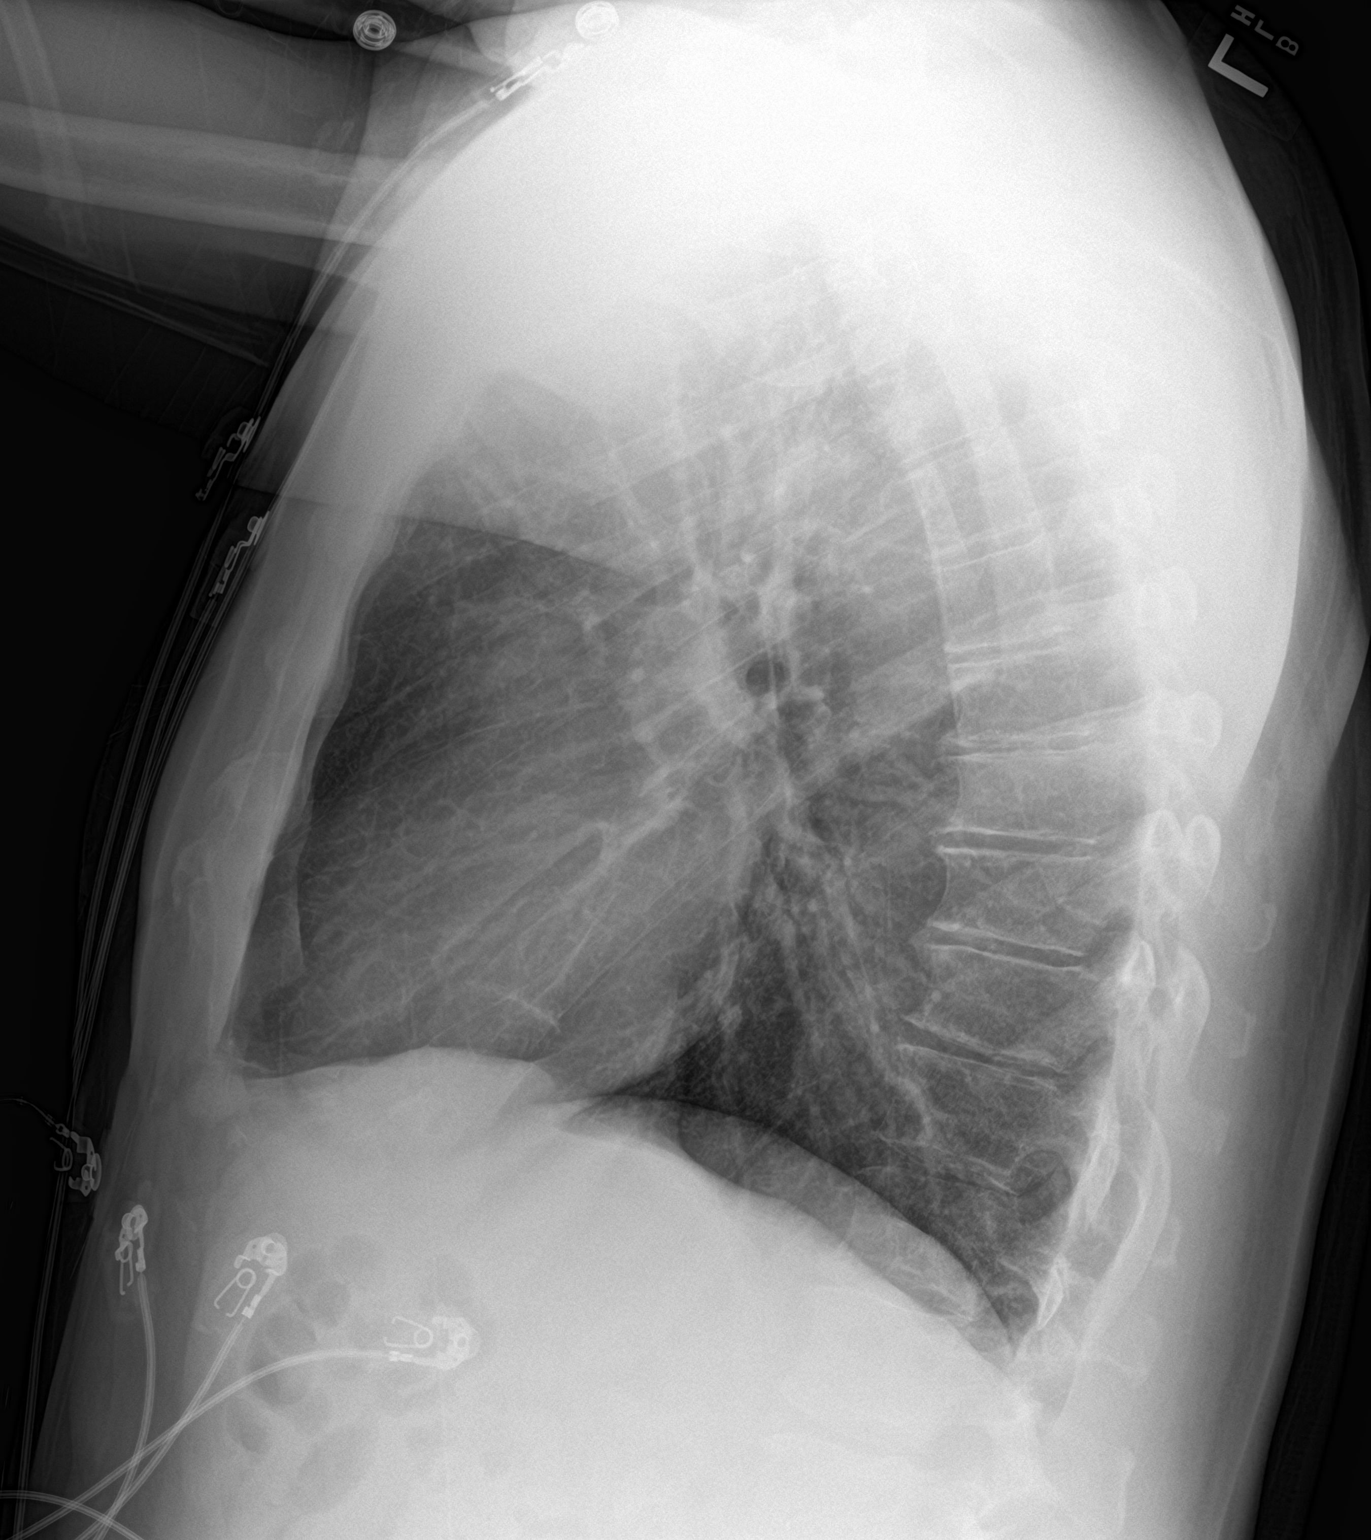

[2 of 2 positions shown; findings below may reference images not displayed]

FINDINGS: Cardiomediastinal contours and hilar structures are normal.

Lungs are clear.  No sign of pleural effusion.

Visualized skeletal structures are unremarkable to the extent
evaluated.
IMPRESSION: No acute cardiopulmonary disease.

## 2021-09-11 DIAGNOSIS — E7801 Familial hypercholesterolemia: Secondary | ICD-10-CM | POA: Diagnosis not present

## 2021-11-15 DIAGNOSIS — Z125 Encounter for screening for malignant neoplasm of prostate: Secondary | ICD-10-CM | POA: Diagnosis not present

## 2021-11-15 DIAGNOSIS — E7801 Familial hypercholesterolemia: Secondary | ICD-10-CM | POA: Diagnosis not present

## 2021-11-22 DIAGNOSIS — Z23 Encounter for immunization: Secondary | ICD-10-CM | POA: Diagnosis not present

## 2021-11-22 DIAGNOSIS — Z0000 Encounter for general adult medical examination without abnormal findings: Secondary | ICD-10-CM | POA: Diagnosis not present

## 2021-11-22 DIAGNOSIS — D126 Benign neoplasm of colon, unspecified: Secondary | ICD-10-CM | POA: Diagnosis not present

## 2021-11-22 DIAGNOSIS — Z1212 Encounter for screening for malignant neoplasm of rectum: Secondary | ICD-10-CM | POA: Diagnosis not present

## 2021-11-22 DIAGNOSIS — R82998 Other abnormal findings in urine: Secondary | ICD-10-CM | POA: Diagnosis not present

## 2021-11-23 ENCOUNTER — Other Ambulatory Visit: Payer: Self-pay | Admitting: Internal Medicine

## 2021-11-23 DIAGNOSIS — Z8249 Family history of ischemic heart disease and other diseases of the circulatory system: Secondary | ICD-10-CM

## 2022-06-19 DIAGNOSIS — F4323 Adjustment disorder with mixed anxiety and depressed mood: Secondary | ICD-10-CM | POA: Diagnosis not present

## 2022-07-22 DIAGNOSIS — F4323 Adjustment disorder with mixed anxiety and depressed mood: Secondary | ICD-10-CM | POA: Diagnosis not present

## 2022-10-04 DIAGNOSIS — F4323 Adjustment disorder with mixed anxiety and depressed mood: Secondary | ICD-10-CM | POA: Diagnosis not present

## 2022-11-11 DIAGNOSIS — F4323 Adjustment disorder with mixed anxiety and depressed mood: Secondary | ICD-10-CM | POA: Diagnosis not present

## 2022-11-28 DIAGNOSIS — Z1212 Encounter for screening for malignant neoplasm of rectum: Secondary | ICD-10-CM | POA: Diagnosis not present

## 2022-12-05 DIAGNOSIS — R82998 Other abnormal findings in urine: Secondary | ICD-10-CM | POA: Diagnosis not present

## 2022-12-05 DIAGNOSIS — E7801 Familial hypercholesterolemia: Secondary | ICD-10-CM | POA: Diagnosis not present

## 2022-12-05 DIAGNOSIS — Z1339 Encounter for screening examination for other mental health and behavioral disorders: Secondary | ICD-10-CM | POA: Diagnosis not present

## 2022-12-05 DIAGNOSIS — Z23 Encounter for immunization: Secondary | ICD-10-CM | POA: Diagnosis not present

## 2022-12-05 DIAGNOSIS — Z0000 Encounter for general adult medical examination without abnormal findings: Secondary | ICD-10-CM | POA: Diagnosis not present

## 2022-12-05 DIAGNOSIS — Z1331 Encounter for screening for depression: Secondary | ICD-10-CM | POA: Diagnosis not present

## 2023-03-18 DIAGNOSIS — D235 Other benign neoplasm of skin of trunk: Secondary | ICD-10-CM | POA: Diagnosis not present

## 2023-03-18 DIAGNOSIS — D225 Melanocytic nevi of trunk: Secondary | ICD-10-CM | POA: Diagnosis not present

## 2023-03-18 DIAGNOSIS — D485 Neoplasm of uncertain behavior of skin: Secondary | ICD-10-CM | POA: Diagnosis not present

## 2023-03-18 DIAGNOSIS — L821 Other seborrheic keratosis: Secondary | ICD-10-CM | POA: Diagnosis not present

## 2023-03-18 DIAGNOSIS — L814 Other melanin hyperpigmentation: Secondary | ICD-10-CM | POA: Diagnosis not present

## 2023-03-18 DIAGNOSIS — C44311 Basal cell carcinoma of skin of nose: Secondary | ICD-10-CM | POA: Diagnosis not present

## 2023-05-06 DIAGNOSIS — C44311 Basal cell carcinoma of skin of nose: Secondary | ICD-10-CM | POA: Diagnosis not present

## 2023-12-09 DIAGNOSIS — E78019 Familial hypercholesterolemia, unspecified: Secondary | ICD-10-CM | POA: Diagnosis not present

## 2023-12-09 DIAGNOSIS — Z125 Encounter for screening for malignant neoplasm of prostate: Secondary | ICD-10-CM | POA: Diagnosis not present

## 2023-12-16 DIAGNOSIS — Z23 Encounter for immunization: Secondary | ICD-10-CM | POA: Diagnosis not present

## 2023-12-16 DIAGNOSIS — Z1331 Encounter for screening for depression: Secondary | ICD-10-CM | POA: Diagnosis not present

## 2023-12-16 DIAGNOSIS — Z1339 Encounter for screening examination for other mental health and behavioral disorders: Secondary | ICD-10-CM | POA: Diagnosis not present

## 2023-12-16 DIAGNOSIS — R82998 Other abnormal findings in urine: Secondary | ICD-10-CM | POA: Diagnosis not present

## 2023-12-16 DIAGNOSIS — Z0000 Encounter for general adult medical examination without abnormal findings: Secondary | ICD-10-CM | POA: Diagnosis not present

## 2023-12-16 DIAGNOSIS — E78019 Familial hypercholesterolemia, unspecified: Secondary | ICD-10-CM | POA: Diagnosis not present
